# Patient Record
Sex: Male | Born: 2000 | Race: White | Hispanic: No | Marital: Single | Smoking: Never smoker
Health system: Southern US, Community
[De-identification: ages and names within clinical notes are randomized; demographics above are authoritative.]

## PROBLEM LIST (undated history)

## (undated) DIAGNOSIS — J45909 Unspecified asthma, uncomplicated: Secondary | ICD-10-CM

---
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
## 2022-02-15 IMAGING — CR DG CHEST 2V
2 series · 2 of 2 positions shown · non-contrast
Comparison: Radiograph 10/28/2008

CLINICAL DATA: Wheezing for 3-4 hours, able to speak in complete
sentences

EXAM:
CHEST - 2 VIEW

[chest pa]
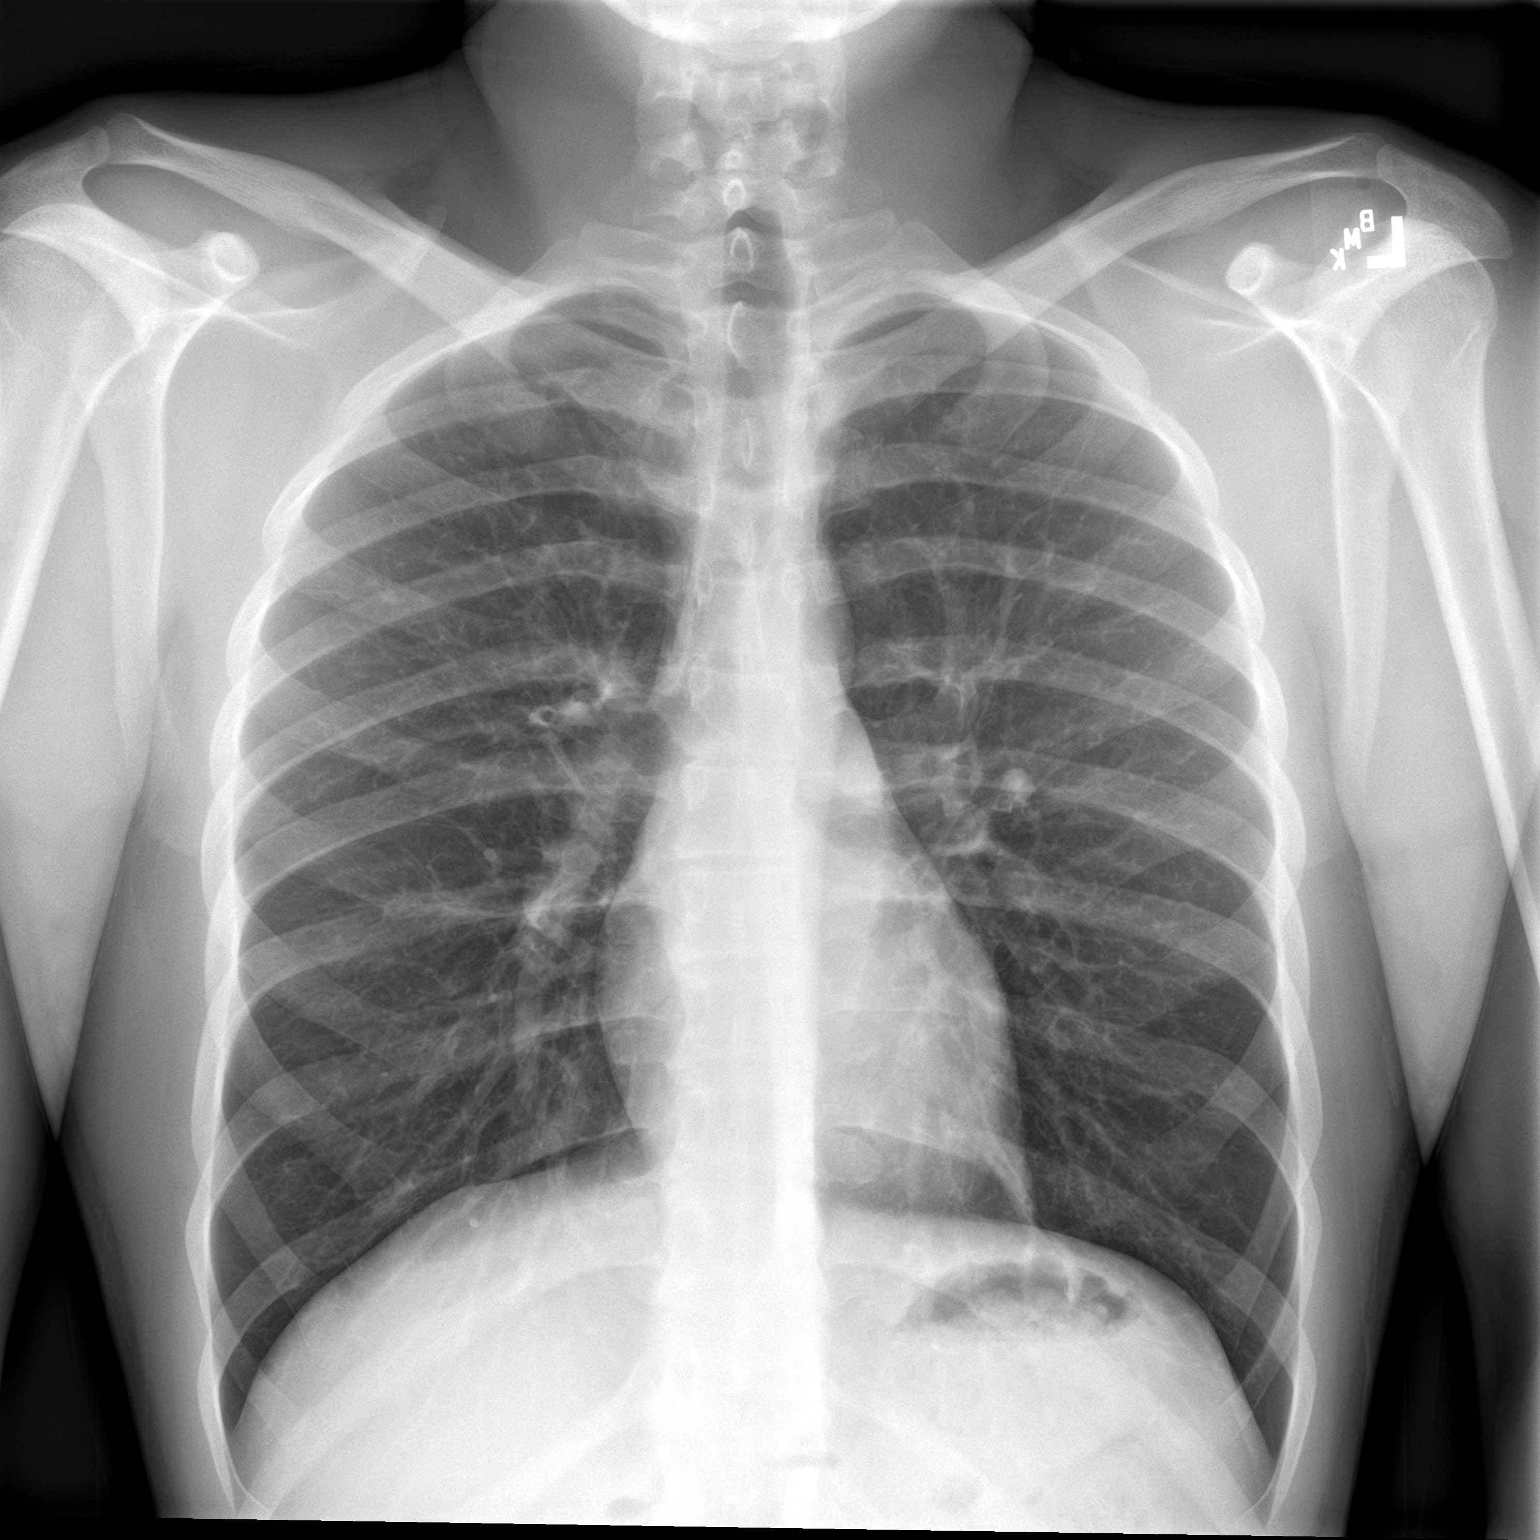

[chest lat]
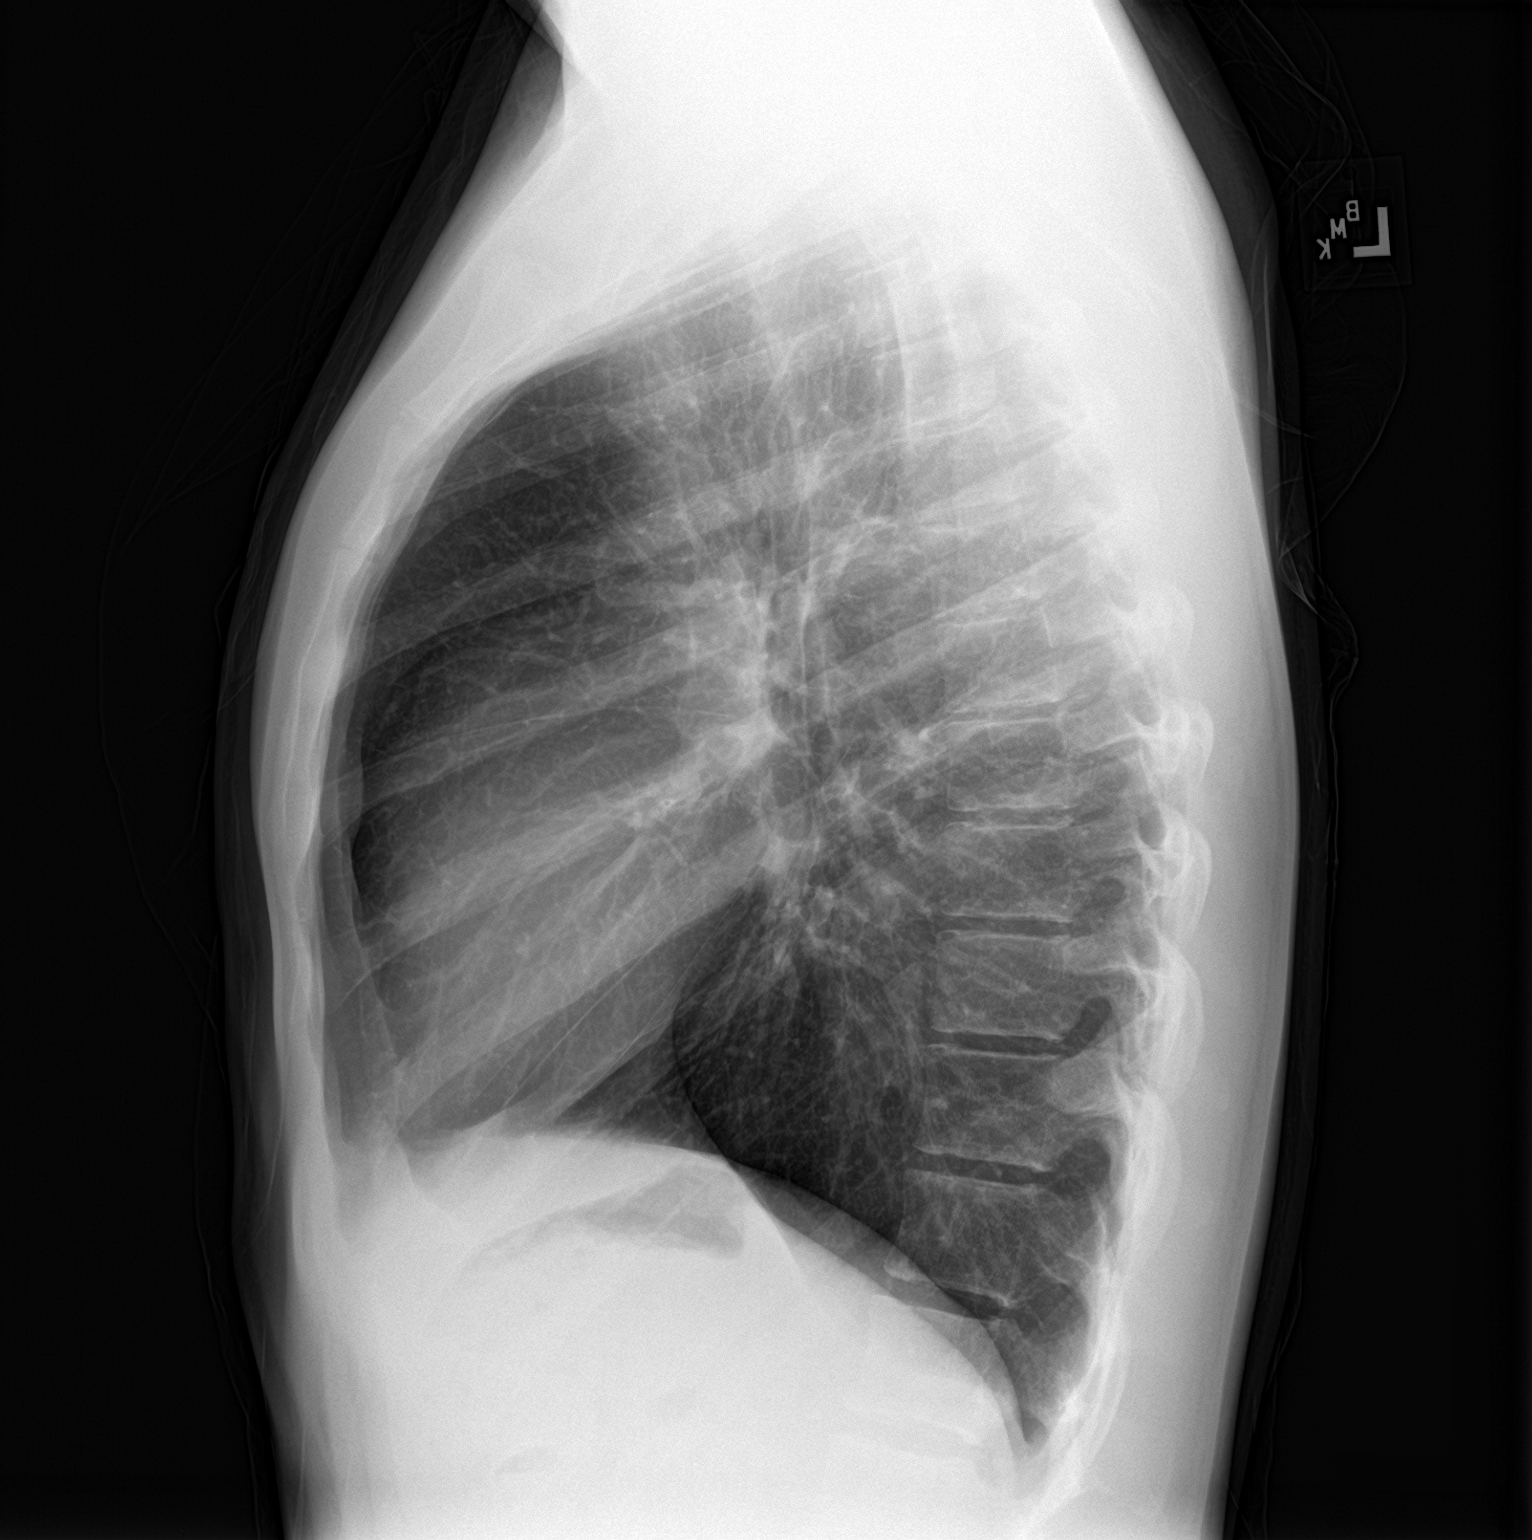

[2 of 2 positions shown; findings below may reference images not displayed]

FINDINGS: Mild airways thickening. No consolidation, features of edema,
pneumothorax, or effusion. Pulmonary vascularity is normally
distributed. The cardiomediastinal contours are unremarkable. No
acute osseous or soft tissue abnormality.
IMPRESSION: Mild airways thickening, could reflect acute bronchitis or reactive
airways.

## 2024-04-20 ENCOUNTER — Ambulatory Visit
Admission: EM | Admit: 2024-04-20 | Discharge: 2024-04-20 | Disposition: A | Discharge status: Home / Self Care | Attending: Physician Assistant | Admitting: Physician Assistant

## 2024-04-20 DIAGNOSIS — M5442 Lumbago with sciatica, left side: Secondary | ICD-10-CM | POA: Diagnosis not present

## 2024-04-20 MED ORDER — NAPROXEN 500 MG PO TABS: 500.0000 mg | ORAL_TABLET | Freq: Two times a day (BID) | ORAL | 0 refills | Status: AC

## 2024-04-20 MED ORDER — TIZANIDINE HCL 4 MG PO TABS: 4.0000 mg | ORAL_TABLET | Freq: Every evening | ORAL | 0 refills | Status: AC | PRN

## 2024-04-20 NOTE — ED Triage Notes (Signed)
 Pt c/o lower back pain radiating down L leg x1 day. States bent over to pick something up & pain started after. Has tried tylenol w/o relief.

## 2024-04-20 NOTE — Discharge Instructions (Signed)
 BACK PAIN: Stressed avoiding painful activities . RICE (REST, ICE, COMPRESSION, ELEVATION) guidelines reviewed. May alternate ice and heat. Consider use of muscle rubs, Salonpas patches, etc. Use medications as directed including muscle relaxers if prescribed. Take anti-inflammatory medications as prescribed or OTC NSAIDs/Tylenol.  F/u with PCP in 7-10 days for reexamination, and please feel free to call or return to the urgent care at any time for any questions or concerns you may have and we will be happy to help you!   BACK PAIN RED FLAGS: If the back pain acutely worsens or there are any red flag symptoms such as numbness/tingling, leg weakness, saddle anesthesia, or loss of bowel/bladder control, go immediately to the ER. Follow up with [place] as scheduled or sooner if the pain does not begin to resolve or if it worsens before the follow up

## 2024-04-20 NOTE — ED Provider Notes (Signed)
 [facility]    CSN: [id number] Arrival date & time: 04/20/24  1651      History   Chief Complaint Chief Complaint  Patient presents with   Back Pain    HPI David Hancock is a 23 y.o. male presenting for left lower back pain that began suddenly yesterday when he bent down to pick something up. Pain radiates to left posterior thigh and hip intermittently. Pain worse with leaning forward, extending back, and standing. He does not report numbness, weakness, loss of bowel/bladder control. Has been taking Tylenol without relief. Patient denies history of back issues. States he recently started lifting weights.  HPI  Past Medical History:  Diagnosis Date   Asthma     Patient Active Problem List   Diagnosis Date Noted   Asthma 11/03/2018    History reviewed. No pertinent surgical history.     Home Medications    Prior to Admission medications   Medication Sig Start Date End Date Taking? Authorizing Provider  naproxen  (NAPROSYN ) 500 MG tablet Take 1 tablet (500 mg total) by mouth 2 (two) times daily. 04/20/24  Yes Arvis Huxley B, PA-C  tiZANidine  (ZANAFLEX ) 4 MG tablet Take 1 tablet (4 mg total) by mouth at bedtime as needed for muscle spasms. 04/20/24  Yes Arvis Huxley NOVAK, PA-C  albuterol  (VENTOLIN  HFA) 108 (90 Base) MCG/ACT inhaler Inhale 1-2 puffs into the lungs every 4 (four) hours as needed for wheezing or shortness of breath. 10/08/20   Claudene Arthea SQUIBB, MD  cetirizine (ZYRTEC) 10 MG tablet Take 10 mg by mouth daily.    [provider]    Family History Family History  Problem Relation Age of Onset   Thyroid disease Mother    Healthy Father     Social History Social History   Tobacco Use   Smoking status: Never   Smokeless tobacco: Never  Vaping Use   Vaping status: Never Used  Substance Use Topics   Alcohol use: No   Drug use: No     Allergies   Patient has no known allergies.   Review of Systems Review of Systems   Genitourinary:  Negative for dysuria, flank pain and hematuria.  Musculoskeletal:  Positive for back pain. Negative for gait problem.  Neurological:  Negative for weakness and numbness.     Physical Exam Triage Vital Signs ED Triage Vitals  Encounter Vitals Group     BP 04/20/24 1703 (!) 144/82     Girls Systolic BP Percentile --      Girls Diastolic BP Percentile --      Boys Systolic BP Percentile --      Boys Diastolic BP Percentile --      Pulse Rate 04/20/24 1703 68     Resp 04/20/24 1703 16     Temp 04/20/24 1703 98.6 F (37 C)     Temp Source 04/20/24 1703 Oral     SpO2 04/20/24 1703 98 %     Weight 04/20/24 1702 170 lb (77.1 kg)     Height 04/20/24 1702 5' 8 (1.727 m)     Head Circumference --      Peak Flow --      Pain Score 04/20/24 1707 5     Pain Loc --      Pain Education --      Exclude from Growth Chart --    No data found.  Updated Vital Signs BP (!) 144/82 (BP Location: Right Arm)   Pulse 68  Temp 98.6 F (37 C) (Oral)   Resp 16   Ht 5' 8 (1.727 m)   Wt 170 lb (77.1 kg)   SpO2 98%   BMI 25.85 kg/m       Physical Exam Vitals and nursing note reviewed.  Constitutional:      General: He is not in acute distress.    Appearance: Normal appearance. He is well-developed. He is not ill-appearing.  HENT:     Head: Normocephalic and atraumatic.  Eyes:     General: No scleral icterus.    Conjunctiva/sclera: Conjunctivae normal.  Cardiovascular:     Rate and Rhythm: Normal rate.  Pulmonary:     Effort: Pulmonary effort is normal. No respiratory distress.  Musculoskeletal:     Cervical back: Neck supple.     Lumbar back: No tenderness or bony tenderness. Decreased range of motion (reduced full flexion and extension of back). Negative right straight leg raise test and negative left straight leg raise test.     Comments: No tenderness of back.  Skin:    General: Skin is warm and dry.     Capillary Refill: Capillary refill takes less than 2  seconds.  Neurological:     General: No focal deficit present.     Mental Status: He is alert. Mental status is at baseline.     Motor: No weakness.     Gait: Gait normal.  Psychiatric:        Mood and Affect: Mood normal.        Behavior: Behavior normal.      UC Treatments / Results  Labs (all labs ordered are listed, but only abnormal results are displayed) Labs Reviewed - No data to display  EKG   Radiology No results found.  Procedures Procedures (including critical care time)  Medications Ordered in UC Medications - No data to display  Initial Impression / Assessment and Plan / UC Course  I have reviewed the triage vital signs and the nursing notes.  Pertinent labs & imaging results that were available during my care of the patient were reviewed by me and considered in my medical decision making (see chart for details).   23 y/o male presents for left lower back pain occasionally radiating to left posterior thigh since yesterday. Reports pain started after bending over to pick something up. No numbness/weakness, loss of bowel/bladder control.   Acute back pain. Lumbar strain. Possible pinched nerve. Patient declines ketorolac injection. Sent prescription Naproxen  and Tizanidine  to pharmacy. Continue Tylenol and consider use of heating pad. Reviewed return and ED precautions.   Final Clinical Impressions(s) / UC Diagnoses   Final diagnoses:  Acute left-sided low back pain with left-sided sciatica     Discharge Instructions      BACK PAIN: Stressed avoiding painful activities . RICE (REST, ICE, COMPRESSION, ELEVATION) guidelines reviewed. May alternate ice and heat. Consider use of muscle rubs, Salonpas patches, etc. Use medications as directed including muscle relaxers if prescribed. Take anti-inflammatory medications as prescribed or OTC NSAIDs/Tylenol.  F/u with PCP in 7-10 days for reexamination, and please feel free to call or return to the urgent care at  any time for any questions or concerns you may have and we will be happy to help you!   BACK PAIN RED FLAGS: If the back pain acutely worsens or there are any red flag symptoms such as numbness/tingling, leg weakness, saddle anesthesia, or loss of bowel/bladder control, go immediately to the ER. Follow up with us  as  scheduled or sooner if the pain does not begin to resolve or if it worsens before the follow up     ED Prescriptions     Medication Sig Dispense Auth. Provider   naproxen  (NAPROSYN ) 500 MG tablet Take 1 tablet (500 mg total) by mouth 2 (two) times daily. 30 tablet Arvis Huxley B, PA-C   tiZANidine  (ZANAFLEX ) 4 MG tablet Take 1 tablet (4 mg total) by mouth at bedtime as needed for muscle spasms. 10 tablet Math Brazie B, PA-C      PDMP not reviewed this encounter.   Arvis Huxley NOVAK, PA-C 04/20/24 1740
